# Patient Record
Sex: Female | Born: 1995 | Race: Black or African American | Hispanic: No | Marital: Single | Smoking: Never smoker
Health system: Southern US, Community
[De-identification: ages and names within clinical notes are randomized; demographics above are authoritative.]

## PROBLEM LIST (undated history)

## (undated) HISTORY — PX: TONSILLECTOMY: SHX5217

---
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
## 2015-01-01 ENCOUNTER — Encounter: Payer: Self-pay | Admitting: Urgent Care

## 2015-01-08 ENCOUNTER — Other Ambulatory Visit: Payer: Self-pay | Admitting: *Deleted

## 2015-01-08 DIAGNOSIS — N6320 Unspecified lump in the left breast, unspecified quadrant: Secondary | ICD-10-CM

## 2015-01-08 DIAGNOSIS — N644 Mastodynia: Secondary | ICD-10-CM

## 2015-01-08 DIAGNOSIS — N6310 Unspecified lump in the right breast, unspecified quadrant: Secondary | ICD-10-CM

## 2015-01-21 ENCOUNTER — Ambulatory Visit
Admission: RE | Admit: 2015-01-21 | Discharge: 2015-01-21 | Disposition: A | Discharge status: Home / Self Care | Payer: BLUE CROSS/BLUE SHIELD | Source: Ambulatory Visit | Attending: Urgent Care | Admitting: Urgent Care

## 2015-01-21 DIAGNOSIS — N6310 Unspecified lump in the right breast, unspecified quadrant: Secondary | ICD-10-CM

## 2015-01-21 DIAGNOSIS — N6320 Unspecified lump in the left breast, unspecified quadrant: Secondary | ICD-10-CM

## 2015-01-21 DIAGNOSIS — N644 Mastodynia: Secondary | ICD-10-CM

## 2016-05-24 ENCOUNTER — Encounter (HOSPITAL_COMMUNITY): Payer: Self-pay

## 2016-05-24 ENCOUNTER — Emergency Department (HOSPITAL_COMMUNITY): Payer: BLUE CROSS/BLUE SHIELD

## 2016-05-24 ENCOUNTER — Emergency Department (HOSPITAL_COMMUNITY)
Admission: EM | Admit: 2016-05-24 | Discharge: 2016-05-24 | Disposition: A | Discharge status: Home / Self Care | Payer: BLUE CROSS/BLUE SHIELD | Attending: Emergency Medicine | Admitting: Emergency Medicine

## 2016-05-24 DIAGNOSIS — Z79899 Other long term (current) drug therapy: Secondary | ICD-10-CM | POA: Diagnosis not present

## 2016-05-24 DIAGNOSIS — B9789 Other viral agents as the cause of diseases classified elsewhere: Secondary | ICD-10-CM

## 2016-05-24 DIAGNOSIS — J069 Acute upper respiratory infection, unspecified: Secondary | ICD-10-CM | POA: Insufficient documentation

## 2016-05-24 LAB — RAPID STREP SCREEN (MED CTR MEBANE ONLY): STREPTOCOCCUS, GROUP A SCREEN (DIRECT): NEGATIVE

## 2016-05-24 MED ORDER — IBUPROFEN 200 MG PO TABS
400.0000 mg | ORAL_TABLET | Freq: Once | ORAL | Status: AC | PRN
Start: 2016-05-24 — End: 2016-05-24
  Administered 2016-05-24: 400 mg via ORAL
  Filled 2016-05-24: qty 2

## 2016-05-24 NOTE — Discharge Instructions (Signed)
Please read instructions below. You can take tylenol or advil for sore throat or fever. You can use a steroid or saline nasal spray for congestion. You can take over-the-counter cough suppressants for cough, such as robitussin. Return to ER for new or worsening symptoms, inability to swallow liquids, shortness of breath.

## 2016-05-24 NOTE — ED Provider Notes (Signed)
[facility]-EMERGENCY DEPT Provider Note   CSN: [id number] Arrival date & time: 05/24/16  1747  By signing my name below, I, Linna Darner, attest that this documentation has been prepared under the direction and in the presence of [place] Russo, PA-C. Electronically Signed: Linna Darner, Scribe. 05/24/2016. 9:15 PM.  History   Chief Complaint Chief Complaint  Patient presents with  . URI    The history is provided by the patient. No language interpreter was used.     HPI Comments: Beth Burnett is a 21 y.o. female who presents to the Emergency Department complaining of persistent URI symptoms for 1.5 weeks. She reports rhinorrhea, nasal congestion, right-sided sinus pain, subjective fevers/chills, a productive cough with yellow sputum, and an intermittent sore throat. Patient reports one episode of nausea and vomiting since onset of her URI symptoms. She noticed some bilateral eye redness today but states only her left eye is erythematous currently. She endorses seasonal allergies but does not believe her current symptoms are related. Patient has tried DayQuil, NyQuil, TheraFlu, and chloraseptic spray with no significant improvement of her symptoms. She has a pertinent PSHx of tonsillectomy. No h/o asthma. Patient denies eye discharge, ear pain, dental problem, trouble swallowing, dysuria, urinary frequency, hematochezia, abdominal pain, chest pain, SOB, or any other associated symptoms.  History reviewed. No pertinent past medical history.  There are no active problems to display for this patient.   Past Surgical History:  Procedure Laterality Date  . TONSILLECTOMY      OB History    No data available       Home Medications    Prior to Admission medications   Medication Sig Start Date End Date Taking? Authorizing Provider  Biotin 1 MG CAPS Take by mouth.    Historical Provider, MD  naproxen sodium (ANAPROX DS) 550 MG tablet Take 1 tablet (550 mg total) by mouth 2 (two) times  daily with a meal. 12/30/14   Wallis Bamberg, PA-C  Norgestimate-Ethinyl Estradiol Triphasic (TRI-SPRINTEC) 0.18/0.215/0.25 MG-35 MCG tablet Take 1 tablet by mouth daily.    Historical Provider, MD    Family History History reviewed. No pertinent family history.  Social History Social History  Substance Use Topics  . Smoking status: Never Smoker  . Smokeless tobacco: Never Used  . Alcohol use 0.0 oz/week     Comment: OCCASIONAL     Allergies   Patient has no known allergies.   Review of Systems Review of Systems  Constitutional: Positive for chills and fever.  HENT: Positive for congestion, rhinorrhea, sinus pain, sinus pressure and sore throat. Negative for dental problem, ear pain and trouble swallowing.   Eyes: Positive for redness. Negative for discharge.  Respiratory: Positive for cough. Negative for shortness of breath.   Cardiovascular: Negative for chest pain.  Gastrointestinal: Positive for nausea (one episode) and vomiting (one episode). Negative for abdominal pain and blood in stool.  Genitourinary: Negative for dysuria and frequency.   Physical Exam Updated Vital Signs BP 138/78   Pulse 76   Temp 98.3 F (36.8 C) (Oral)   Resp 17   Ht  (1.626 m)   Wt 54.4 kg   LMP 05/24/2016   SpO2 100%   BMI 20.60 kg/m   Physical Exam  Constitutional: She appears well-developed and well-nourished.  HENT:  Head: Normocephalic and atraumatic.  Right Ear: Tympanic membrane, external ear and ear canal normal.  Left Ear: Tympanic membrane, external ear and ear canal normal.  Nose: Nose normal. Right sinus exhibits no maxillary  sinus tenderness. Left sinus exhibits no maxillary sinus tenderness.  Mouth/Throat: Uvula is midline, oropharynx is clear and moist and mucous membranes are normal. No trismus in the jaw. No uvula swelling.  Sinuses non-tender.  Eyes: EOM are normal. Pupils are equal, round, and reactive to light. Right eye exhibits no discharge. Left eye exhibits  no discharge.  L conjunctiva mildly erythematous. No discharge noted.  Neck: Normal range of motion.  Cardiovascular: Normal rate, regular rhythm, normal heart sounds and intact distal pulses.  Exam reveals no friction rub.   No murmur heard. Pulmonary/Chest: Effort normal and breath sounds normal. No respiratory distress. She has no wheezes. She has no rales.  Lungs are clear bilaterally.  Abdominal: Soft. Bowel sounds are normal. She exhibits no distension and no mass. There is no tenderness.  Lymphadenopathy:    She has no cervical adenopathy.  Neurological: She is alert.  Skin: Skin is warm.  Psychiatric: She has a normal mood and affect. Her behavior is normal.  Nursing note and vitals reviewed.  ED Treatments / Results  Labs (all labs ordered are listed, but only abnormal results are displayed) Labs Reviewed  RAPID STREP SCREEN (NOT AT [facility])  CULTURE, GROUP A STREP [facility])    EKG  EKG Interpretation None       Radiology Dg Chest 2 View  Result Date: 05/24/2016 CLINICAL DATA:  Cough and congestion EXAM: CHEST  2 VIEW COMPARISON:  None. FINDINGS: The heart size and mediastinal contours are within normal limits. Both lungs are clear. The visualized skeletal structures are unremarkable. IMPRESSION: No active cardiopulmonary disease. Electronically Signed   By: Alcide Clever M.D.   On: 05/24/2016 18:30    Procedures Procedures (including critical care time)  DIAGNOSTIC STUDIES: Oxygen Saturation is 100% on RA, normal by my interpretation.    COORDINATION OF CARE: 9:25 PM Discussed treatment plan with pt at bedside and pt agreed to plan.  Medications Ordered in ED Medications  ibuprofen (ADVIL,MOTRIN) tablet 400 mg (400 mg Oral Given 05/24/16 1809)     Initial Impression / Assessment and Plan / ED Course  I have reviewed the triage vital signs and the nursing notes.  Pertinent labs & imaging results that were available during my care of the patient were reviewed by me  and considered in my medical decision making (see chart for details).    Pt symptoms consistent with URI. Exam reassuring. CXR negative for acute infiltrate. Strep neg. Pt will be discharged with symptomatic treatment.  Discussed return precautions.  Pt is hemodynamically stable & in NAD prior to discharge.  Discussed results, findings, treatment and follow up. Patient advised of return precautions. Patient verbalized understanding and agreed with plan.  Final Clinical Impressions(s) / ED Diagnoses   Final diagnoses:  Viral URI with cough    New Prescriptions Discharge Medication List as of 05/24/2016  9:32 PM     I personally performed the services described in this documentation, which was scribed in my presence. The recorded information has been reviewed and is accurate.    [place] N Russo, PA-C 05/25/16 0155    Loren Racer, MD 05/27/16 [phone]

## 2016-05-24 NOTE — ED Triage Notes (Signed)
PT C/O COUGH WITH YELLOW SPUTUM, RUNNY NOSE, AND FEVER X1 1/2 WEEKS. DENIES N/V/D. PT STS SHE HAS TAKEN OTC MEDICINES W/O RELIEF.

## 2016-05-27 LAB — CULTURE, GROUP A STREP (THRC)

## 2018-07-19 IMAGING — CR DG CHEST 2V
2 series · 2 of 2 positions shown · non-contrast
Comparison: None.

CLINICAL DATA: Cough and congestion

EXAM:
CHEST  2 VIEW

[w chest pa]
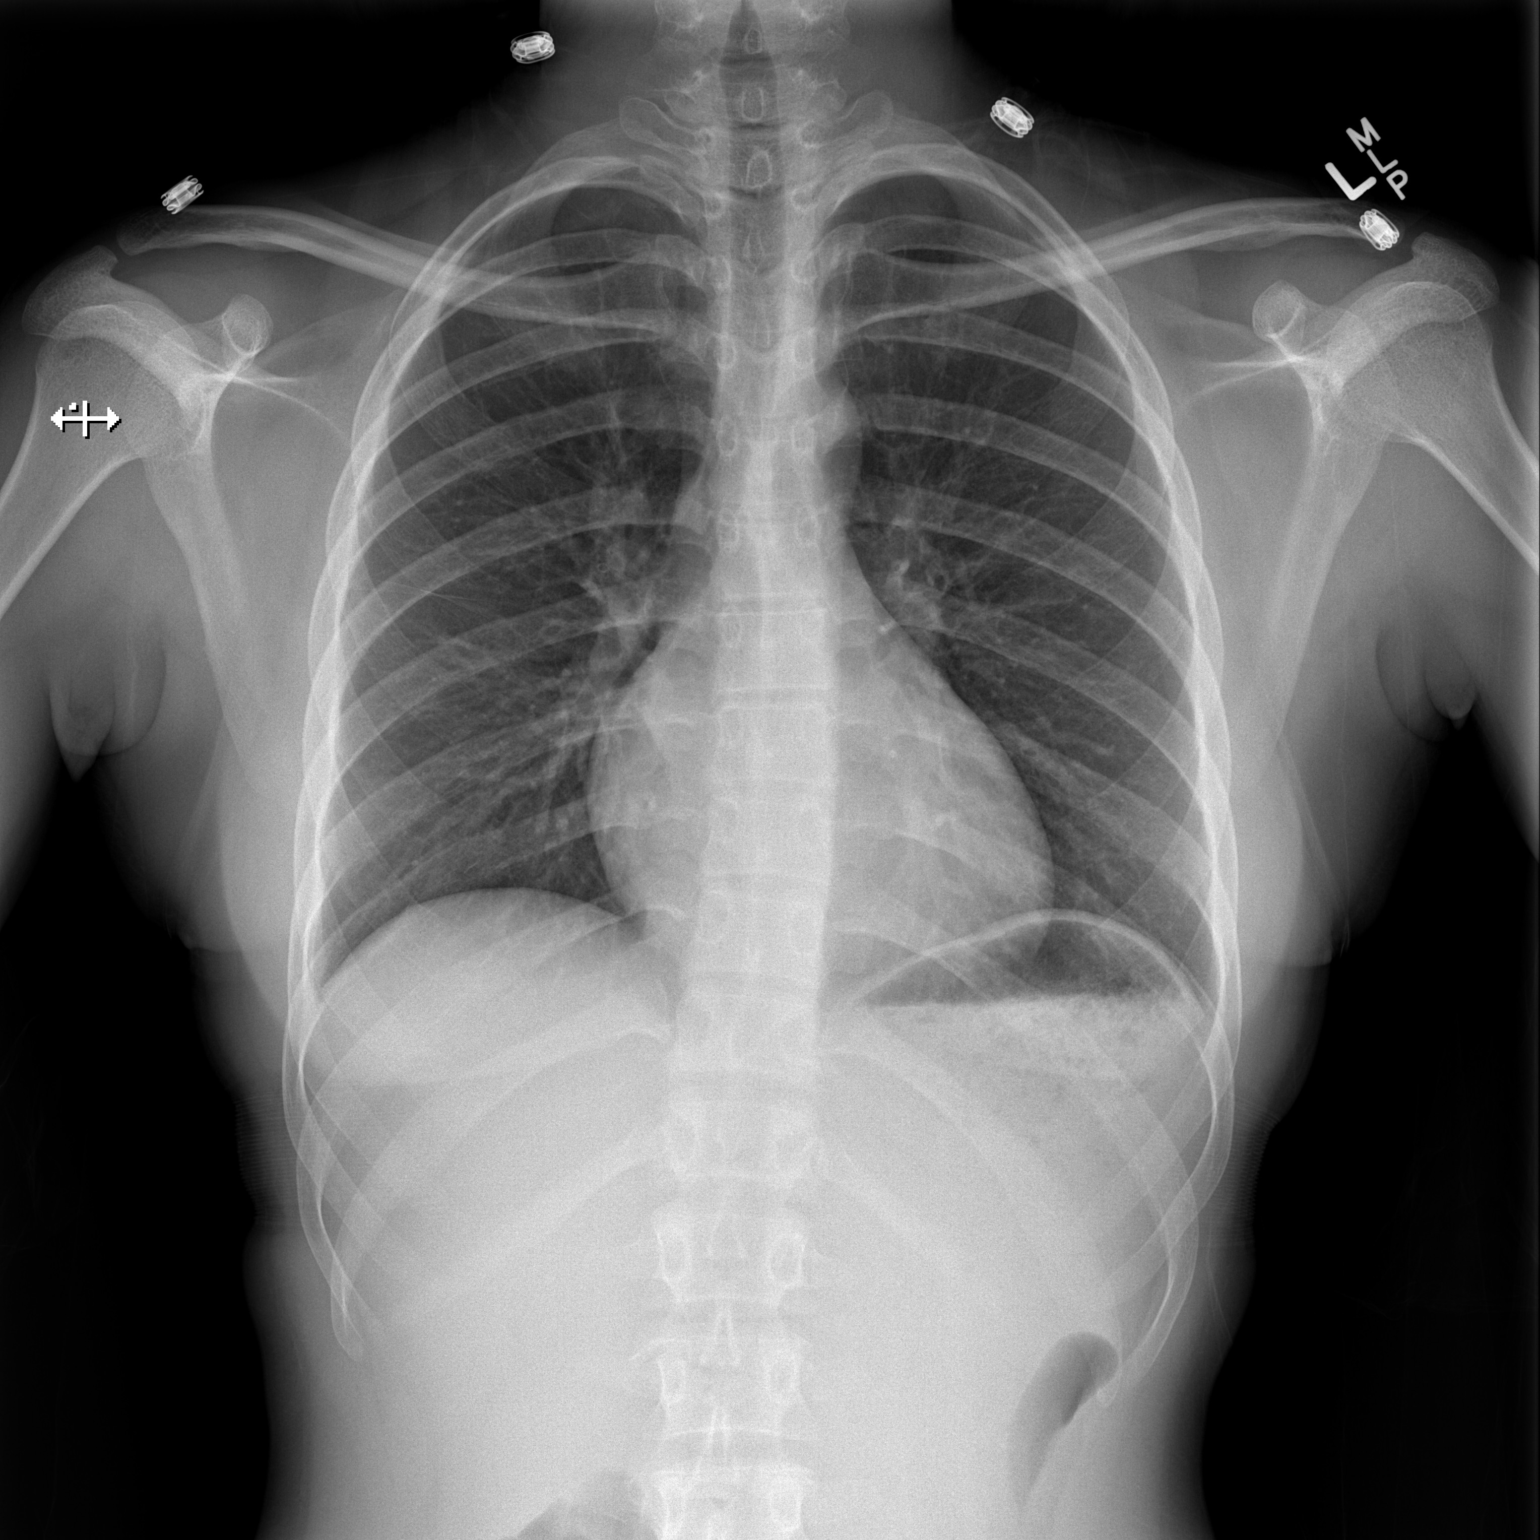

[w chest lat]
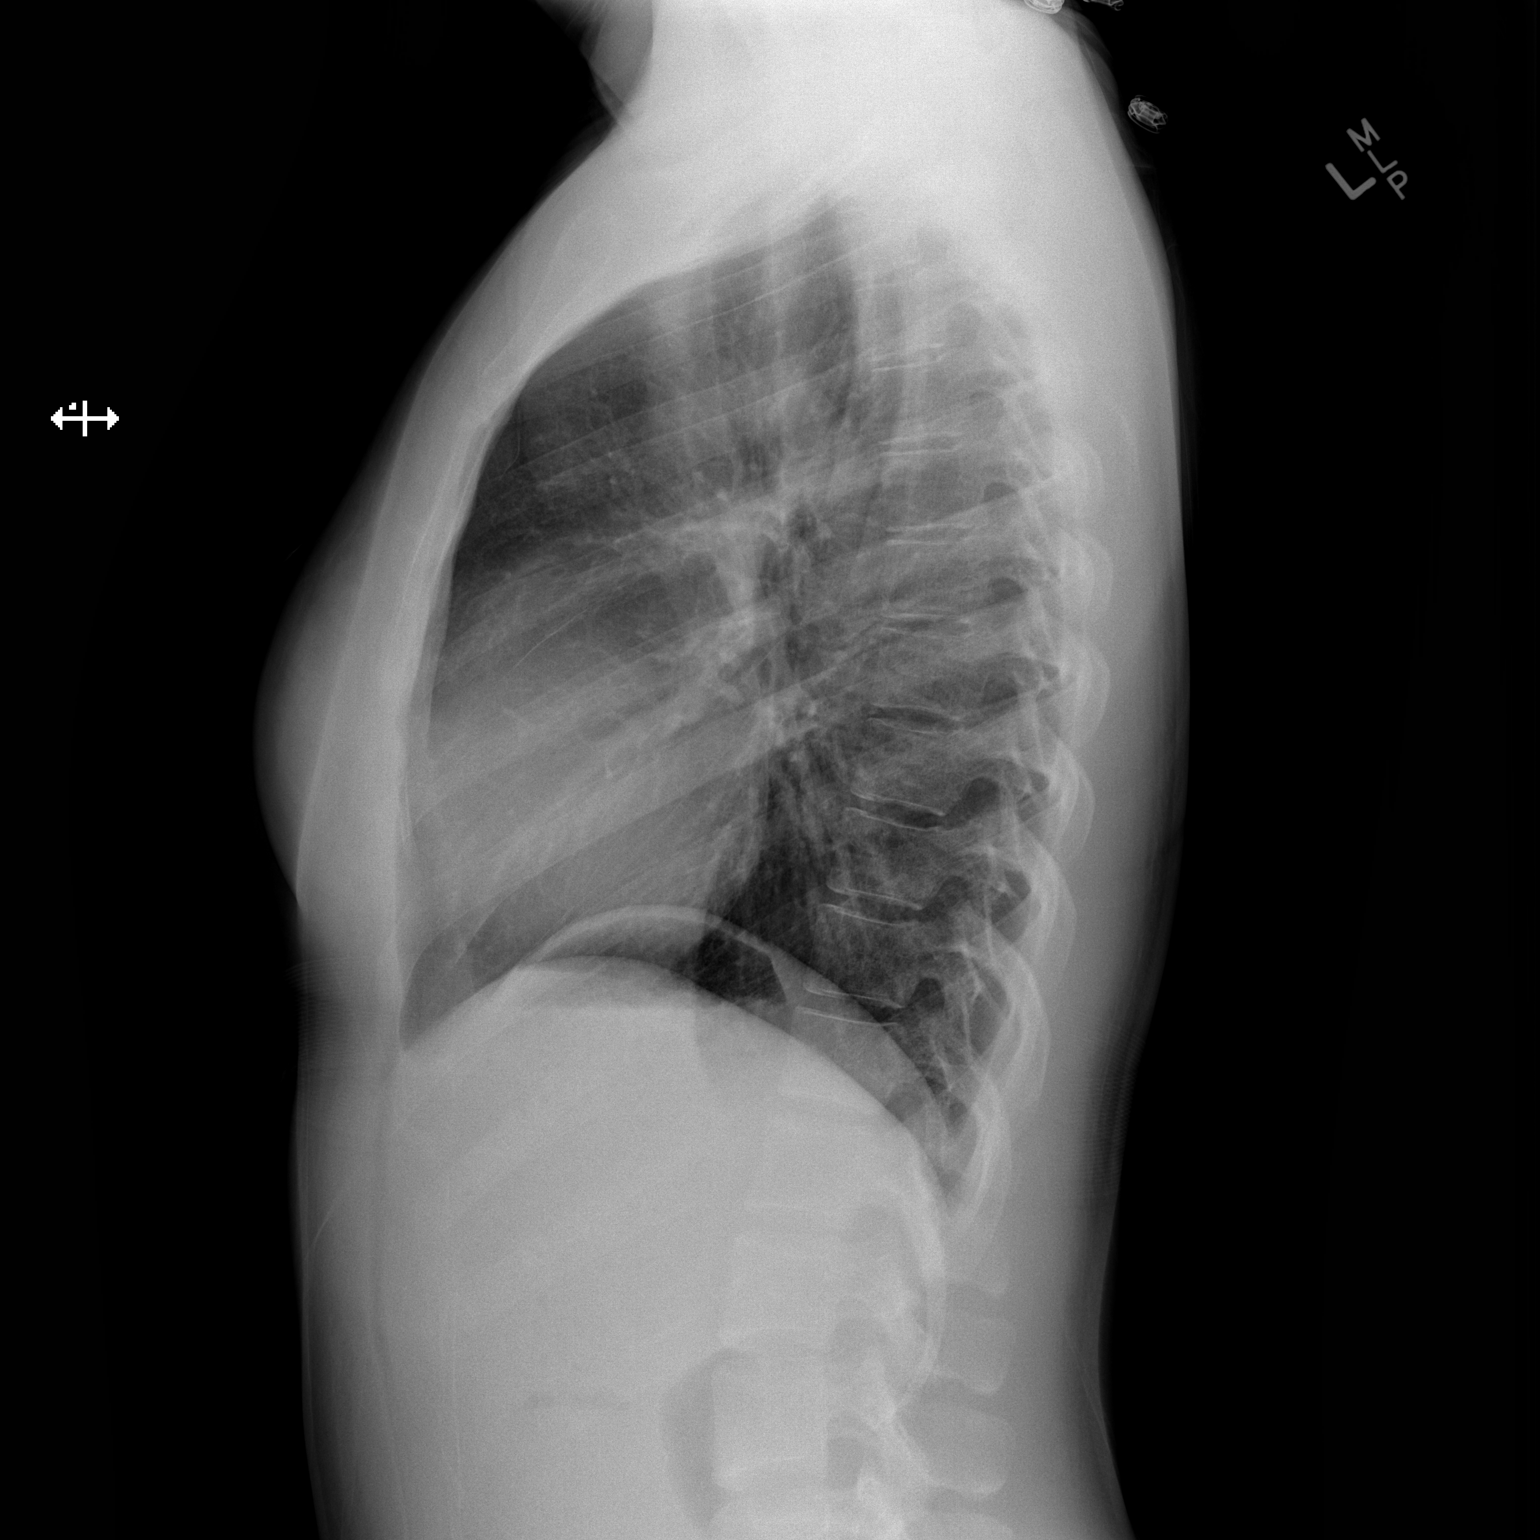

[2 of 2 positions shown; findings below may reference images not displayed]

FINDINGS: The heart size and mediastinal contours are within normal limits.
Both lungs are clear. The visualized skeletal structures are
unremarkable.
IMPRESSION: No active cardiopulmonary disease.

## 2019-03-31 ENCOUNTER — Emergency Department (HOSPITAL_COMMUNITY)
Admission: EM | Admit: 2019-03-31 | Discharge: 2019-03-31 | Disposition: A | Discharge status: Home / Self Care | Payer: BLUE CROSS/BLUE SHIELD | Attending: Emergency Medicine | Admitting: Emergency Medicine

## 2019-03-31 ENCOUNTER — Encounter (HOSPITAL_COMMUNITY): Payer: Self-pay | Admitting: Emergency Medicine

## 2019-03-31 ENCOUNTER — Other Ambulatory Visit: Payer: Self-pay

## 2019-03-31 DIAGNOSIS — R208 Other disturbances of skin sensation: Secondary | ICD-10-CM | POA: Insufficient documentation

## 2019-03-31 DIAGNOSIS — G43909 Migraine, unspecified, not intractable, without status migrainosus: Secondary | ICD-10-CM | POA: Insufficient documentation

## 2019-03-31 DIAGNOSIS — G43009 Migraine without aura, not intractable, without status migrainosus: Secondary | ICD-10-CM

## 2019-03-31 DIAGNOSIS — R946 Abnormal results of thyroid function studies: Secondary | ICD-10-CM | POA: Insufficient documentation

## 2019-03-31 DIAGNOSIS — R7989 Other specified abnormal findings of blood chemistry: Secondary | ICD-10-CM

## 2019-03-31 LAB — BASIC METABOLIC PANEL
Anion gap: 8 (ref 5–15)
BUN: 10 mg/dL (ref 6–20)
CO2: 24 mmol/L (ref 22–32)
Calcium: 9.2 mg/dL (ref 8.9–10.3)
Chloride: 104 mmol/L (ref 98–111)
Creatinine, Ser: 0.81 mg/dL (ref 0.44–1.00)
GFR calc Af Amer: >60 mL/min (ref >60)
GFR calc non Af Amer: >60 mL/min (ref >60)
Glucose, Bld: 85 mg/dL (ref 70–99)
Potassium: 3.7 mmol/L (ref 3.5–5.1)
Sodium: 136 mmol/L (ref 135–145)

## 2019-03-31 LAB — CBC
HCT: 37.1 % (ref 36.0–46.0)
Hemoglobin: 12.4 g/dL (ref 12.0–15.0)
MCH: 31.4 pg (ref 26.0–34.0)
MCHC: 33.4 g/dL (ref 30.0–36.0)
MCV: 93.9 fL (ref 80.0–100.0)
Platelets: 262 10*3/uL (ref 150–400)
RBC: 3.95 MIL/uL (ref 3.87–5.11)
RDW: 13.2 % (ref 11.5–15.5)
WBC: 6.5 10*3/uL (ref 4.0–10.5)
nRBC: 0 % (ref 0.0–0.2)

## 2019-03-31 LAB — TSH: TSH: 5.315 u[IU]/mL — ABNORMAL HIGH (ref 0.350–4.500)

## 2019-03-31 LAB — HCG, QUANTITATIVE, PREGNANCY: hCG, Beta Chain, Quant, S: <1 m[IU]/mL (ref <5)

## 2019-03-31 MED ORDER — KETOROLAC TROMETHAMINE 30 MG/ML IJ SOLN
30.0000 mg | Freq: Once | INTRAMUSCULAR | Status: DC
  Filled 2019-03-31: qty 1

## 2019-03-31 MED ORDER — PROCHLORPERAZINE EDISYLATE 10 MG/2ML IJ SOLN
10.0000 mg | Freq: Once | INTRAMUSCULAR | Status: DC
  Filled 2019-03-31: qty 2

## 2019-03-31 NOTE — ED Triage Notes (Signed)
Pt reports pain at temples bilaterally for the last 3-4 months. Pt denies any improvement with OTC meds and reports sensitivity to light.

## 2019-03-31 NOTE — Discharge Instructions (Signed)
Continue to take the over-the-counter medications as prescribed discussed.  Consider medication such as Excedrin Migraine.  Your blood test showed that she had an elevated TSH (thyroid-stimulating hormone.)  Please follow-up with your primary care doctor for further evaluation of your thyroid blood tests.  It is possible this could be contributing to your cold sensitivity.

## 2019-03-31 NOTE — ED Provider Notes (Signed)
[facility] DEPT Provider Note   CSN: [id number] Arrival date & time: 03/31/19  1924     History Chief Complaint  Patient presents with  . Headache    Beth Burnett is a 24 y.o. female.  HPI   Patient presents to the ED for evaluation of complaints of headache as well as temperature sensitivity.  Patient states the symptoms started about 3 to 4 months ago.  She has intermittent sharp pain in her head in the temporal region.  When she gets these headaches she will have sensitivity to light.  She denies any visual changes.  She denies any nausea or vomiting.  She denies any numbness or weakness or trouble with her balance or coordination.  Patient denies any fevers or chills.  No neck stiffness.  Patient also notices that she always feels chilled compared to other people.  She denies any weight loss.  She does have history of heavy menstrual periods.  Patient has not seen anyone for this condition before but because of the persistent nature she decided to have it evaluated tonight.  History reviewed. No pertinent past medical history.  There are no problems to display for this patient.   Past Surgical History:  Procedure Laterality Date  . TONSILLECTOMY       OB History   No obstetric history on file.     History reviewed. No pertinent family history.  Social History   Tobacco Use  . Smoking status: Never Smoker  . Smokeless tobacco: Never Used  Substance Use Topics  . Alcohol use: Yes    Alcohol/week: 0.0 standard drinks    Comment: OCCASIONAL  . Drug use: No    Home Medications Prior to Admission medications   Medication Sig Start Date End Date Taking? Authorizing Provider  Biotin 1 MG CAPS Take by mouth.    [provider]  naproxen sodium (ANAPROX DS) 550 MG tablet Take 1 tablet (550 mg total) by mouth 2 (two) times daily with a meal. 12/30/14   Jaynee Eagles, PA-C  Norgestimate-Ethinyl Estradiol Triphasic (TRI-SPRINTEC)  0.18/0.215/0.25 MG-35 MCG tablet Take 1 tablet by mouth daily.    [provider]    Allergies    Patient has no known allergies.  Review of Systems   Review of Systems  All other systems reviewed and are negative.   Physical Exam Updated Vital Signs BP 109/86 (BP Location: Left Arm)   Pulse 79   Temp 98.4 F (36.9 C) (Oral)   Resp 18   Ht 1.626 m (5\' 4" )   Wt 59.9 kg   LMP 03/17/2019   SpO2 100%   BMI 22.66 kg/m   Physical Exam Vitals and nursing note reviewed.  Constitutional:      General: She is not in acute distress.    Appearance: She is well-developed.  HENT:     Head: Normocephalic and atraumatic.     Right Ear: External ear normal.     Left Ear: External ear normal.  Eyes:     General: No scleral icterus.       Right eye: No discharge.        Left eye: No discharge.     Conjunctiva/sclera: Conjunctivae normal.  Neck:     Trachea: No tracheal deviation.  Cardiovascular:     Rate and Rhythm: Normal rate and regular rhythm.  Pulmonary:     Effort: Pulmonary effort is normal. No respiratory distress.     Breath sounds: Normal breath sounds. No  stridor. No wheezing or rales.  Abdominal:     General: Bowel sounds are normal. There is no distension.     Palpations: Abdomen is soft.     Tenderness: There is no abdominal tenderness. There is no guarding or rebound.  Musculoskeletal:        General: No tenderness.     Cervical back: Neck supple.  Skin:    General: Skin is warm and dry.     Findings: No rash.  Neurological:     Mental Status: She is alert and oriented to person, place, and time.     Cranial Nerves: No cranial nerve deficit (No facial droop, extraocular movements intact, tongue midline ).     Sensory: No sensory deficit.     Motor: No abnormal muscle tone or seizure activity.     Coordination: Coordination normal.     Comments: No pronator drift bilateral upper extrem, able to hold both legs off bed for 5 seconds, sensation intact  in all extremities, no visual field cuts, no left or right sided neglect, normal finger-nose exam bilaterally, no nystagmus noted      ED Results / Procedures / Treatments   Labs (all labs ordered are listed, but only abnormal results are displayed) Labs Reviewed  TSH - Abnormal; Notable for the following components:      Result Value   TSH 5.315 (*)    All other components within normal limits  CBC  BASIC METABOLIC PANEL  HCG, QUANTITATIVE, PREGNANCY    EKG None  Radiology No results found.  Procedures Procedures (including critical care time)  Medications Ordered in ED Medications  prochlorperazine (COMPAZINE) injection 10 mg (10 mg Intravenous Refused 03/31/19 2021)  ketorolac (TORADOL) 30 MG/ML injection 30 mg (30 mg Intravenous Refused 03/31/19 2021)    ED Course  I have reviewed the triage vital signs and the nursing notes.  Pertinent labs & imaging results that were available during my care of the patient were reviewed by me and considered in my medical decision making (see chart for details).    MDM Rules/Calculators/A&P                      Pt presented with recurrent headaches.  Normal neuro exam.  Suspect migraine etiology.  Migraine cocktail offered but pt did not require any medications tonight.  Also complained of being cold.  Labs notable for elevated tsh.  Hypothyroidism a possibility.  Will need to have additional thyroid testing.  Discussed follow up with PCP for further evaluation. Final Clinical Impression(s) / ED Diagnoses Final diagnoses:  Abnormal thyroid blood test  Migraine without aura and without status migrainosus, not intractable    Rx / DC Orders ED Discharge Orders    None       Linwood Dibbles, MD 03/31/19 2140

## 2021-10-21 ENCOUNTER — Telehealth: Payer: Self-pay | Admitting: *Deleted
